# Patient Record
Sex: Male | Born: 1980
Health system: Southern US, Community
[De-identification: ages and names within clinical notes are randomized; demographics above are authoritative.]

## PROBLEM LIST (undated history)

## (undated) HISTORY — PX: OTHER SURGICAL HISTORY: SHX169

---

## 2016-04-15 ENCOUNTER — Encounter: Payer: Self-pay | Admitting: Internal Medicine

## 2016-04-15 ENCOUNTER — Ambulatory Visit (INDEPENDENT_AMBULATORY_CARE_PROVIDER_SITE_OTHER): Payer: Commercial Managed Care - PPO | Admitting: Internal Medicine

## 2016-04-15 VITALS — BP 118/79 | HR 82 | Resp 16 | Ht 69.0 in | Wt 167.5 lb

## 2016-04-15 DIAGNOSIS — J3089 Other allergic rhinitis: Secondary | ICD-10-CM | POA: Diagnosis not present

## 2016-04-15 DIAGNOSIS — F17200 Nicotine dependence, unspecified, uncomplicated: Secondary | ICD-10-CM | POA: Insufficient documentation

## 2016-04-15 DIAGNOSIS — Z23 Encounter for immunization: Secondary | ICD-10-CM | POA: Insufficient documentation

## 2016-04-15 MED ORDER — FLUTICASONE PROPIONATE 50 MCG/ACT NA SUSP: 2.0000 | Freq: Every day | NASAL | 6 refills | Status: DC

## 2016-04-15 NOTE — Patient Instructions (Signed)
Health Maintenance  Topic Date Due  . HIV Screening  08/27/1994  . INFLUENZA VACCINE  02/27/2017 (Originally 12/29/2015)  . TETANUS/TDAP  04/15/2026

## 2016-04-15 NOTE — Progress Notes (Signed)
Date:  04/15/2016   Name:  Darin RoysJames Arenas   DOB:  [birth year]   MRN:  [id number]   Chief Complaint: Establish Care (No PCP in Past ) New patient here to establish care.  He has no serious health concerns.  His wife and employers have been encouraging him to get a PCP.  Dental problem - he has on going dental issues and is gradually having all his teeth extracted.  Sinus congestion/red eyes - he has intermittent allergy sx but does not know of a trigger.  He has tried benadryl and sudafed but not allegra or zyrtec.  He denies vision change or sinus discharge currently.  Review of Systems  Constitutional: Negative for chills, fatigue and fever.  HENT: Positive for dental problem and sinus pressure. Negative for sore throat.   Eyes: Positive for redness and itching. Negative for pain, discharge and visual disturbance.  Respiratory: Negative for cough, chest tightness and shortness of breath.   Cardiovascular: Negative for chest pain, palpitations and leg swelling.  Gastrointestinal: Negative for abdominal pain.  Genitourinary: Negative for dysuria.  Musculoskeletal: Positive for arthralgias and gait problem.  Neurological: Negative for dizziness and headaches.  Psychiatric/Behavioral: Positive for sleep disturbance. Negative for dysphoric mood.    Patient Active Problem List   Diagnosis Date Noted  . Tobacco use disorder 04/15/2016    Prior to Admission medications   Medication Sig Start Date End Date Taking? Authorizing Provider  ibuprofen (ADVIL,MOTRIN) 200 MG tablet Take 200 mg by mouth every 6 (six) hours as needed.   Yes Historical Provider, MD    No Known Allergies  Past Surgical History:  Procedure Laterality Date  . none      Social History  Substance Use Topics  . Smoking status: Current Every Day Smoker    Packs/day: 1.00    Years: 5.00    Types: Cigarettes  . Smokeless tobacco: Never Used  . Alcohol use Yes     Comment: occasional      Medication list has  been reviewed and updated.   Physical Exam  Constitutional: He is oriented to person, place, and time. He appears well-developed. No distress.  HENT:  Head: Normocephalic and atraumatic.  Eyes: EOM are normal. Pupils are equal, round, and reactive to light. Right eye exhibits chemosis. Left eye exhibits chemosis.  Neck: Normal range of motion. Neck supple. No thyromegaly present.  Cardiovascular: Normal rate, regular rhythm and normal heart sounds.   Pulmonary/Chest: Effort normal and breath sounds normal. No respiratory distress. He has no wheezes.  Abdominal: Soft. Normal appearance and bowel sounds are normal. He exhibits no distension. There is no tenderness. There is no rebound.  Musculoskeletal: He exhibits no edema or tenderness.  Neurological: He is alert and oriented to person, place, and time.  Skin: Skin is warm and dry. No rash noted.  Psychiatric: He has a normal mood and affect. His speech is normal and behavior is normal. Thought content normal.  Nursing note and vitals reviewed.   BP 118/79   Pulse 82   Resp 16   Ht 5\' 9"  (1.753 m)   Wt 167 lb 8 oz (76 kg)   SpO2 98%   BMI 24.74 kg/m   Assessment and Plan: 1. Environmental and seasonal allergies With sinus congestion and red eyes - fluticasone (FLONASE) 50 MCG/ACT nasal spray; Place 2 sprays into both nostrils daily.  Dispense: 16 g; Refill: 6  2. Tobacco use disorder Discussed options   Bari EdwardLaura Berglund, MD [facility]  04/15/2016

## 2016-06-02 ENCOUNTER — Ambulatory Visit: Payer: Commercial Managed Care - PPO | Admitting: Internal Medicine

## 2017-03-23 ENCOUNTER — Ambulatory Visit: Payer: Commercial Managed Care - PPO | Admitting: Internal Medicine

## 2017-03-24 ENCOUNTER — Ambulatory Visit (INDEPENDENT_AMBULATORY_CARE_PROVIDER_SITE_OTHER): Payer: Commercial Managed Care - PPO | Admitting: Internal Medicine

## 2017-03-24 ENCOUNTER — Encounter: Payer: Self-pay | Admitting: Internal Medicine

## 2017-03-24 VITALS — BP 116/78 | HR 86 | Ht 69.0 in | Wt 163.2 lb

## 2017-03-24 DIAGNOSIS — Z23 Encounter for immunization: Secondary | ICD-10-CM

## 2017-03-24 DIAGNOSIS — K645 Perianal venous thrombosis: Secondary | ICD-10-CM | POA: Insufficient documentation

## 2017-03-24 DIAGNOSIS — Z872 Personal history of diseases of the skin and subcutaneous tissue: Secondary | ICD-10-CM

## 2017-03-24 MED ORDER — HYDROCORTISONE 2.5 % RE CREA: 1.0000 "application " | TOPICAL_CREAM | Freq: Two times a day (BID) | RECTAL | 0 refills | Status: DC

## 2017-03-24 NOTE — Progress Notes (Signed)
Date:  03/24/2017   Name:  Darin Morales   DOB:  1980/12/18   MRN:  [id number]   Chief Complaint: Hemorrhoids (Had this problem last week - used otc meds. much better than was before. Still having a little discomfort. No blood. Also, having some bumps popping up around rectum. he "takes care of them" and then they go away. ) and Immunizations (Flu Vaccine)  HPI  Hemorrhoids -patient reports having episodes of what he suspected were hemorrhoids in years past.  Never diagnosed or treated by physician.  Most recently last week had hemorrhoids that were much larger and more tender than previously.  No bleeding.  No preceding bowel problems such as diarrhea or constipation.  He is an over-the-counter hemorrhoid cream and he feels much better.  Recurrent skin abscesses -he reports what sounds like recurrent small abscesses around the buttock region.  These have never required draining.  They have been small but have left some scarring.  Currently no active lesions are present.   Review of Systems  Constitutional: Negative for chills, fatigue and fever.  Respiratory: Negative for chest tightness and shortness of breath.   Cardiovascular: Negative for chest pain.  Gastrointestinal: Negative for abdominal pain, blood in stool, constipation and diarrhea.  Skin: Negative for color change, rash and wound.  Psychiatric/Behavioral: Negative for sleep disturbance.    Patient Active Problem List   Diagnosis Date Noted  . Tobacco use disorder 04/15/2016  . Need for diphtheria-tetanus-pertussis (Tdap) vaccine 04/15/2016    Prior to Admission medications   Medication Sig Start Date End Date Taking? Authorizing Provider  fluticasone (FLONASE) 50 MCG/ACT nasal spray Place 2 sprays into both nostrils daily. 04/15/16  Yes Reubin MilanBerglund, Hadia Minier H, MD  ibuprofen (ADVIL,MOTRIN) 200 MG tablet Take 200 mg by mouth every 6 (six) hours as needed.   Yes [provider]    No Known Allergies  Past Surgical  History:  Procedure Laterality Date  . none      Social History  Substance Use Topics  . Smoking status: Current Every Day Smoker    Packs/day: 1.00    Years: 5.00    Types: Cigarettes  . Smokeless tobacco: Never Used  . Alcohol use Yes     Comment: occasional      Medication list has been reviewed and updated.  PHQ 2/9 Scores 03/24/2017  PHQ - 2 Score 0    Physical Exam  Constitutional: He is oriented to person, place, and time. He appears well-developed. No distress.  HENT:  Head: Normocephalic and atraumatic.  Pulmonary/Chest: Effort normal. No respiratory distress.  Genitourinary: Rectal exam shows external hemorrhoid (two thrombosed hemorroids at 6 and 9 oclcol).  Musculoskeletal: Normal range of motion.  Neurological: He is alert and oriented to person, place, and time.  Skin: Skin is warm and dry. No rash noted.  Several small scars on right buttock and coccyx region c/w previous healed lesions  Psychiatric: He has a normal mood and affect. His behavior is normal. Thought content normal.  Nursing note and vitals reviewed.   BP 116/78   Pulse 86   Ht 5\' 9"  (1.753 m)   Wt 163 lb 3.2 oz (74 kg)   SpO2 100%   BMI 24.10 kg/m   Assessment and Plan: 1. External thrombosed hemorrhoids Pt is reassured Return if sx worsen - hydrocortisone (ANUSOL-HC) 2.5 % rectal cream; Place 1 application rectally 2 (two) times daily.  Dispense: 30 g; Refill: 0  2. Need for influenza vaccination -  Flu Vaccine QUAD 36+ mos IM   Meds ordered this encounter  Medications  . hydrocortisone (ANUSOL-HC) 2.5 % rectal cream    Sig: Place 1 application rectally 2 (two) times daily.    Dispense:  30 g    Refill:  0    Partially dictated using Animal nutritionist. Any errors are unintentional.  Bari Edward, MD [facility]  03/24/2017

## 2017-06-23 ENCOUNTER — Encounter: Payer: Self-pay | Admitting: Internal Medicine

## 2017-06-23 ENCOUNTER — Ambulatory Visit: Payer: Commercial Managed Care - PPO | Admitting: Internal Medicine

## 2017-06-23 VITALS — BP 112/68 | HR 94 | Ht 69.0 in | Wt 164.0 lb

## 2017-06-23 DIAGNOSIS — J40 Bronchitis, not specified as acute or chronic: Secondary | ICD-10-CM

## 2017-06-23 DIAGNOSIS — G4726 Circadian rhythm sleep disorder, shift work type: Secondary | ICD-10-CM

## 2017-06-23 MED ORDER — LEVOFLOXACIN 500 MG PO TABS: 500.0000 mg | ORAL_TABLET | Freq: Every day | ORAL | 0 refills | Status: DC

## 2017-06-23 MED ORDER — ZOLPIDEM TARTRATE ER 12.5 MG PO TBCR: 12.5000 mg | EXTENDED_RELEASE_TABLET | Freq: Every evening | ORAL | 0 refills | Status: DC | PRN

## 2017-06-23 NOTE — Patient Instructions (Signed)
Delsym cough over the counter

## 2017-06-23 NOTE — Progress Notes (Signed)
Date:  06/23/2017   Name:  Darin Morales   DOB:  November 25, 1980   MRN:  [id number]   Chief Complaint: Cough (Started Sunday- no production. Dry cough. Bend down to tie shoes- headache, and heaviness in head and eyes. Fever- chills.  ) and Insomnia (Only sleeps maybe 2 hours in a day. Very tired. Works 3rd shift. Now out of the blue cannot get any rest. Has done this for 40 years. )  Cough  This is a new problem. The current episode started in the past 7 days. The problem has been unchanged. The cough is non-productive. Associated symptoms include a fever and headaches. Pertinent negatives include no chest pain, chills, sore throat, shortness of breath or wheezing. There is no history of environmental allergies.  Insomnia  Primary symptoms: sleep disturbance, difficulty falling asleep, frequent awakening.  The onset quality is undetermined. The problem occurs nightly. Exacerbated by: third shift work.      Review of Systems  Constitutional: Positive for fatigue and fever. Negative for chills.  HENT: Positive for congestion, sinus pressure and sinus pain. Negative for sore throat.   Respiratory: Positive for cough and chest tightness. Negative for shortness of breath and wheezing.   Cardiovascular: Negative for chest pain and palpitations.  Gastrointestinal: Negative for abdominal pain.  Musculoskeletal: Negative for arthralgias.  Skin: Negative for color change.  Allergic/Immunologic: Negative for environmental allergies.  Neurological: Positive for headaches. Negative for dizziness, tremors and light-headedness.  Hematological: Negative for adenopathy.  Psychiatric/Behavioral: Positive for sleep disturbance. Negative for dysphoric mood. The patient has insomnia.     Patient Active Problem List   Diagnosis Date Noted  . External thrombosed hemorrhoids 03/24/2017  . History of folliculitis 03/24/2017  . Tobacco use disorder 04/15/2016  . Need for diphtheria-tetanus-pertussis (Tdap)  vaccine 04/15/2016    Prior to Admission medications   Medication Sig Start Date End Date Taking? Authorizing Provider  fluticasone (FLONASE) 50 MCG/ACT nasal spray Place 2 sprays into both nostrils daily. 04/15/16  Yes Reubin Milan, MD  hydrocortisone (ANUSOL-HC) 2.5 % rectal cream Place 1 application rectally 2 (two) times daily. 03/24/17  Yes Reubin Milan, MD  ibuprofen (ADVIL,MOTRIN) 200 MG tablet Take 200 mg by mouth every 6 (six) hours as needed.   Yes [provider]    No Known Allergies  Past Surgical History:  Procedure Laterality Date  . none      Social History   Tobacco Use  . Smoking status: Current Every Day Smoker    Packs/day: 1.00    Years: 5.00    Pack years: 5.00    Types: Cigarettes  . Smokeless tobacco: Never Used  Substance Use Topics  . Alcohol use: Yes    Comment: occasional   . Drug use: No     Medication list has been reviewed and updated.  PHQ 2/9 Scores 03/24/2017  PHQ - 2 Score 0    Physical Exam  Constitutional: He is oriented to person, place, and time. He appears well-developed and well-nourished.  HENT:  Right Ear: External ear normal.  Left Ear: Tympanic membrane, external ear and ear canal normal. Tympanic membrane is not erythematous and not retracted.  Nose: Right sinus exhibits maxillary sinus tenderness and frontal sinus tenderness. Left sinus exhibits maxillary sinus tenderness and frontal sinus tenderness.  Mouth/Throat: Uvula is midline and mucous membranes are normal. No oral lesions. Posterior oropharyngeal erythema present. No oropharyngeal exudate.  Right ear occluded by cerumen  Cardiovascular: Normal rate, regular rhythm  and normal heart sounds.  Pulmonary/Chest: He has wheezes. He has no rales.  Lymphadenopathy:    He has no cervical adenopathy.  Neurological: He is alert and oriented to person, place, and time.    BP 112/68   Pulse 94   Ht 5\' 9"  (1.753 m)   Wt 164 lb (74.4 kg)   SpO2 99%    BMI 24.22 kg/m   Assessment and Plan: 1. Bronchitis Delsym otc cough syrup - levofloxacin (LEVAQUIN) 500 MG tablet; Take 1 tablet (500 mg total) by mouth daily.  Dispense: 7 tablet; Refill: 0  2. Shift work sleep disorder Begin ambien when antibiotics complete - zolpidem (AMBIEN CR) 12.5 MG CR tablet; Take 1 tablet (12.5 mg total) by mouth at bedtime as needed for sleep.  Dispense: 30 tablet; Refill: 0   Meds ordered this encounter  Medications  . levofloxacin (LEVAQUIN) 500 MG tablet    Sig: Take 1 tablet (500 mg total) by mouth daily.    Dispense:  7 tablet    Refill:  0  . zolpidem (AMBIEN CR) 12.5 MG CR tablet    Sig: Take 1 tablet (12.5 mg total) by mouth at bedtime as needed for sleep.    Dispense:  30 tablet    Refill:  0    Partially dictated using Animal nutritionistDragon software. Any errors are unintentional.  Bari EdwardLaura Berglund, MD [facility]  06/23/2017

## 2017-10-10 ENCOUNTER — Encounter: Payer: Self-pay | Admitting: Internal Medicine

## 2017-10-10 ENCOUNTER — Ambulatory Visit (INDEPENDENT_AMBULATORY_CARE_PROVIDER_SITE_OTHER): Payer: Commercial Managed Care - PPO | Admitting: Internal Medicine

## 2017-10-10 ENCOUNTER — Ambulatory Visit
Admission: RE | Admit: 2017-10-10 | Discharge: 2017-10-10 | Disposition: A | Discharge status: Home / Self Care | Payer: Commercial Managed Care - PPO | Source: Ambulatory Visit | Attending: Internal Medicine | Admitting: Internal Medicine

## 2017-10-10 VITALS — BP 118/78 | HR 96 | Resp 16 | Ht 69.0 in | Wt 156.6 lb

## 2017-10-10 DIAGNOSIS — M25531 Pain in right wrist: Secondary | ICD-10-CM | POA: Diagnosis not present

## 2017-10-10 DIAGNOSIS — M79641 Pain in right hand: Secondary | ICD-10-CM | POA: Insufficient documentation

## 2017-10-10 NOTE — Progress Notes (Signed)
Date:  10/10/2017   Name:  Darin Morales   DOB:  [birth year]   MRN:  [id number]   Chief Complaint: Hand Pain Larey Seat Friday while grilling and hurt Right hand while catching fall in grass. Also had a cooking tool in hand and scratched his hand up when fell. Unable to lift or do anything with R hand at all without pain. Some swelling noticed in fingers )  Hand Injury   The incident occurred 3 to 5 days ago. The incident occurred at home. The injury mechanism was a fall (onto his clenched fist onto grass). The pain is present in the right hand. The quality of the pain is described as burning and shooting. The pain radiates to the right hand. The pain is moderate. Pertinent negatives include no chest pain, numbness or tingling. The symptoms are aggravated by movement. Treatments tried: salonpas. The treatment provided no relief.  He also sustained a small laceration on his index finger.  Tdap is up to date.   Review of Systems  Constitutional: Negative for chills and fatigue.  Respiratory: Negative for chest tightness and shortness of breath.   Cardiovascular: Negative for chest pain and palpitations.  Musculoskeletal: Positive for arthralgias (hand and wrist pain, swelling in fingers).  Neurological: Negative for tingling and numbness.    Patient Active Problem List   Diagnosis Date Noted  . External thrombosed hemorrhoids 03/24/2017  . History of folliculitis 03/24/2017  . Tobacco use disorder 04/15/2016  . Need for diphtheria-tetanus-pertussis (Tdap) vaccine 04/15/2016    Prior to Admission medications   Medication Sig Start Date End Date Taking? Authorizing Provider  zolpidem (AMBIEN CR) 12.5 MG CR tablet Take 1 tablet (12.5 mg total) by mouth at bedtime as needed for sleep. Patient not taking: Reported on 10/10/2017 06/23/17   Reubin Milan, MD    No Known Allergies  Past Surgical History:  Procedure Laterality Date  . none      Social History   Tobacco Use  . Smoking  status: Current Every Day Smoker    Packs/day: 0.50    Years: 5.00    Pack years: 2.50    Types: Cigarettes  . Smokeless tobacco: Never Used  Substance Use Topics  . Alcohol use: Yes    Comment: occasional   . Drug use: No     Medication list has been reviewed and updated.  PHQ 2/9 Scores 10/10/2017 03/24/2017  PHQ - 2 Score 0 0    Physical Exam  Constitutional: He is oriented to person, place, and time. He appears well-developed. No distress.  HENT:  Head: Normocephalic and atraumatic.  Pulmonary/Chest: Effort normal. No respiratory distress.  Musculoskeletal: Normal range of motion.       Right wrist: He exhibits tenderness and swelling. He exhibits no deformity.  Tender at base of 5th finger along lateral hand Mild swelling of fingers 2-5 No deformity noted Grip limited by wrist and lateral hand pain Pulses intact, sensation normal  Neurological: He is alert and oriented to person, place, and time.  Skin: Skin is warm and dry. No rash noted.  Psychiatric: He has a normal mood and affect. His behavior is normal. Thought content normal.  Nursing note and vitals reviewed.   BP 118/78   Pulse 96   Resp 16   Ht  (1.753 m)   Wt 156 lb 9.6 oz (71 kg)   SpO2 100%   BMI 23.13 kg/m   Assessment and Plan: 1. 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   Partially dictated using Animal nutritionist. Any errors are unintentional.  Bari Edward, MD [facility]  10/10/2017

## 2017-10-11 ENCOUNTER — Encounter: Payer: Self-pay | Admitting: Internal Medicine

## 2018-02-01 ENCOUNTER — Encounter: Payer: Self-pay | Admitting: Internal Medicine

## 2018-02-01 ENCOUNTER — Ambulatory Visit: Payer: Commercial Managed Care - PPO | Admitting: Internal Medicine

## 2018-02-01 VITALS — BP 96/58 | HR 83 | Temp 98.5°F | Ht 69.0 in | Wt 156.0 lb

## 2018-02-01 DIAGNOSIS — H6690 Otitis media, unspecified, unspecified ear: Secondary | ICD-10-CM

## 2018-02-01 MED ORDER — AMOXICILLIN-POT CLAVULANATE 875-125 MG PO TABS: 1.0000 | ORAL_TABLET | Freq: Two times a day (BID) | ORAL | 0 refills | Status: AC

## 2018-02-01 NOTE — Patient Instructions (Signed)
Tylenol 325 mg 2 of these, alternate with ibuprofen 800 mg - one or the other every 4-6 hours

## 2018-02-01 NOTE — Progress Notes (Signed)
Date:  02/01/2018   Name:  Darin Morales   DOB:  08/20/1980   MRN:  [id number]   Chief Complaint: Fever (Itchy throat. Only sleeping a few hours because he is hot and SOB. )  Sore Throat   This is a new problem. The current episode started in the past 7 days. The problem has been gradually worsening. Maximum temperature: chills and sweats but han not taken temperature. The pain is moderate. Associated symptoms include ear pain and headaches. Pertinent negatives include no coughing, swollen glands or trouble swallowing. He has tried NSAIDs for the symptoms. The treatment provided mild relief.      Review of Systems  Constitutional: Positive for chills, fatigue and fever.  HENT: Positive for ear pain and sore throat. Negative for rhinorrhea, sinus pressure, trouble swallowing and voice change.   Eyes: Negative for visual disturbance.  Respiratory: Negative for cough, chest tightness and wheezing.   Neurological: Positive for headaches. Negative for dizziness.    Patient Active Problem List   Diagnosis Date Noted  . External thrombosed hemorrhoids 03/24/2017  . History of folliculitis 03/24/2017  . Tobacco use disorder 04/15/2016  . Need for diphtheria-tetanus-pertussis (Tdap) vaccine 04/15/2016    No Known Allergies  Past Surgical History:  Procedure Laterality Date  . none      Social History   Tobacco Use  . Smoking status: Current Every Day Smoker    Packs/day: 0.50    Years: 5.00    Pack years: 2.50    Types: Cigarettes  . Smokeless tobacco: Never Used  Substance Use Topics  . Alcohol use: Yes    Comment: occasional   . Drug use: No     Medication list has been reviewed and updated.  Current Meds  Medication Sig  . zolpidem (AMBIEN CR) 12.5 MG CR tablet Take 1 tablet (12.5 mg total) by mouth at bedtime as needed for sleep.    PHQ 2/9 Scores 10/10/2017 03/24/2017  PHQ - 2 Score 0 0    Physical Exam  Constitutional: He is oriented to person, place, and  time. He appears well-developed. He has a sickly appearance. No distress.  HENT:  Head: Normocephalic and atraumatic.  Left Ear: Tympanic membrane is erythematous and retracted.  Nose: Right sinus exhibits no maxillary sinus tenderness. Left sinus exhibits no maxillary sinus tenderness.  Mouth/Throat: Posterior oropharyngeal erythema present. No oropharyngeal exudate or posterior oropharyngeal edema.  Right canal obscured by cerumen  Pulmonary/Chest: Effort normal. No respiratory distress.  Musculoskeletal: Normal range of motion.  Neurological: He is alert and oriented to person, place, and time.  Skin: Skin is warm and dry. No rash noted.  Psychiatric: He has a normal mood and affect. His behavior is normal. Thought content normal.  Nursing note and vitals reviewed.   BP (!) 96/58 (BP Location: Right Arm, Patient Position: Sitting, Cuff Size: Normal)   Pulse 83   Temp 98.5 F (36.9 C) (Oral)   Ht 5\' 9"  (1.753 m)   Wt 156 lb (70.8 kg)   SpO2 100%   BMI 23.04 kg/m   Assessment and Plan: 1. Acute otitis media, unspecified otitis media type Alternate tylenol with advil for pain,fever,chills - amoxicillin-clavulanate (AUGMENTIN) 875-125 MG tablet; Take 1 tablet by mouth 2 (two) times daily for 10 days.  Dispense: 20 tablet; Refill: 0   Meds ordered this encounter  Medications  . amoxicillin-clavulanate (AUGMENTIN) 875-125 MG tablet    Sig: Take 1 tablet by mouth 2 (two) times daily for  10 days.    Dispense:  20 tablet    Refill:  0    Partially dictated using Animal nutritionist. Any errors are unintentional.  Bari Edward, MD [facility]  02/01/2018   There are no diagnoses linked to this encounter.

## 2018-02-05 ENCOUNTER — Other Ambulatory Visit: Payer: Self-pay

## 2018-04-20 ENCOUNTER — Encounter: Payer: Self-pay | Admitting: Internal Medicine

## 2018-04-20 ENCOUNTER — Ambulatory Visit: Payer: Commercial Managed Care - PPO | Admitting: Internal Medicine

## 2018-04-20 VITALS — BP 112/68 | HR 89 | Temp 98.1°F | Ht 69.0 in | Wt 168.0 lb

## 2018-04-20 DIAGNOSIS — J0100 Acute maxillary sinusitis, unspecified: Secondary | ICD-10-CM

## 2018-04-20 MED ORDER — AMOXICILLIN-POT CLAVULANATE 875-125 MG PO TABS: 1.0000 | ORAL_TABLET | Freq: Two times a day (BID) | ORAL | 0 refills | Status: AC

## 2018-04-20 NOTE — Progress Notes (Signed)
Date:  04/20/2018   Name:  Darin Morales   DOB:  22-Sep-1980   MRN:  [id number]   Chief Complaint: Fatigue (Weakness, fatigue, and sleeping too much X 1 week ago. Body aches with ribs. )  URI   This is a new problem. The current episode started in the past 7 days. Associated symptoms include coughing, sinus pain and a sore throat. Pertinent negatives include no chest pain, diarrhea, ear pain, headaches, nausea, rash or wheezing. He has tried NSAIDs for the symptoms. The treatment provided mild relief.      Review of Systems  Constitutional: Positive for fatigue and fever. Negative for chills.  HENT: Positive for sinus pressure, sinus pain and sore throat. Negative for ear pain, trouble swallowing and voice change.   Respiratory: Positive for cough. Negative for chest tightness, shortness of breath and wheezing.   Cardiovascular: Negative for chest pain, palpitations and leg swelling.  Gastrointestinal: Negative for constipation, diarrhea and nausea.  Skin: Negative for color change and rash.  Neurological: Positive for light-headedness. Negative for dizziness and headaches.  Hematological: Negative for adenopathy.    Patient Active Problem List   Diagnosis Date Noted  . External thrombosed hemorrhoids 03/24/2017  . History of folliculitis 03/24/2017  . Tobacco use disorder 04/15/2016  . Need for diphtheria-tetanus-pertussis (Tdap) vaccine 04/15/2016    No Known Allergies  Past Surgical History:  Procedure Laterality Date  . none      Social History   Tobacco Use  . Smoking status: Current Every Day Smoker    Packs/day: 0.50    Years: 5.00    Pack years: 2.50    Types: Cigarettes  . Smokeless tobacco: Never Used  Substance Use Topics  . Alcohol use: Yes    Comment: occasional   . Drug use: No     Medication list has been reviewed and updated.  No outpatient medications have been marked as taking for the 04/20/18 encounter (Office Visit) with Reubin Milan,  MD.    [facility] 2/9 Scores 10/10/2017 03/24/2017  PHQ - 2 Score 0 0    Physical Exam  Constitutional: He is oriented to person, place, and time. He appears well-developed and well-nourished.  HENT:  Left Ear: External ear and ear canal normal. Tympanic membrane is not erythematous and not retracted.  Nose: Right sinus exhibits maxillary sinus tenderness. Right sinus exhibits no frontal sinus tenderness. Left sinus exhibits maxillary sinus tenderness. Left sinus exhibits no frontal sinus tenderness.  Mouth/Throat: Uvula is midline and mucous membranes are normal. No oral lesions. Posterior oropharyngeal erythema present. No oropharyngeal exudate.  Right ear canal obscured completed by cerumen  Neck: Normal range of motion. Neck supple.  Cardiovascular: Normal rate, regular rhythm and normal heart sounds.  Pulmonary/Chest: Breath sounds normal. He has no wheezes. He has no rales.  Lymphadenopathy:    He has no cervical adenopathy.  Neurological: He is alert and oriented to person, place, and time.    BP 112/68 (BP Location: Right Arm, Patient Position: Sitting, Cuff Size: Normal)   Pulse 89   Temp 98.1 F (36.7 C) (Oral)   Ht 5\' 9"  (1.753 m)   Wt 168 lb (76.2 kg)   SpO2 100%   BMI 24.81 kg/m   Assessment and Plan: 1. Acute non-recurrent maxillary sinusitis Continue Advil as needed Increase fluids, rest - amoxicillin-clavulanate (AUGMENTIN) 875-125 MG tablet; Take 1 tablet by mouth 2 (two) times daily for 10 days.  Dispense: 20 tablet; Refill: 0   Partially  dictated using Animal nutritionistDragon software. Any errors are unintentional.  Bari EdwardLaura Unita Detamore, MD [facility]  04/20/2018

## 2018-04-23 ENCOUNTER — Encounter: Payer: Self-pay | Admitting: Internal Medicine

## 2018-06-12 ENCOUNTER — Ambulatory Visit: Payer: Commercial Managed Care - PPO | Admitting: Internal Medicine

## 2018-06-12 ENCOUNTER — Encounter: Payer: Self-pay | Admitting: Internal Medicine

## 2018-06-12 VITALS — BP 118/70 | HR 106 | Ht 69.0 in | Wt 159.6 lb

## 2018-06-12 DIAGNOSIS — M778 Other enthesopathies, not elsewhere classified: Secondary | ICD-10-CM | POA: Diagnosis not present

## 2018-06-12 MED ORDER — MELOXICAM 15 MG PO TABS: 15.0000 mg | ORAL_TABLET | Freq: Every day | ORAL | 0 refills | Status: DC

## 2018-06-12 NOTE — Patient Instructions (Signed)
Tennis elbow strap - use while working.

## 2018-06-12 NOTE — Progress Notes (Signed)
Date:  06/12/2018   Name:  Darin Morales   DOB:  06-18-1980   MRN:  [id number]   Chief Complaint: Arm Pain (R) arm pain. Pain radiates from elbow down in to hand. Started X 2 weeks. Unsure of injured. If arm is straightened or if picking anything up it gets worse. )  Arm Pain   There was no injury mechanism. The pain is present in the right forearm and right elbow. The quality of the pain is described as aching. The pain does not radiate. The pain is mild. The pain has been fluctuating since the incident. Pertinent negatives include no chest pain, muscle weakness, numbness or tingling. The symptoms are aggravated by movement and lifting. He has tried acetaminophen for the symptoms. The treatment provided mild relief.    Review of Systems  Constitutional: Negative for chills and fatigue.  Respiratory: Negative for chest tightness and shortness of breath.   Cardiovascular: Negative for chest pain.  Musculoskeletal: Positive for arthralgias. Negative for myalgias.  Neurological: Negative for tingling, tremors, weakness and numbness.    Patient Active Problem List   Diagnosis Date Noted  . External thrombosed hemorrhoids 03/24/2017  . History of folliculitis 03/24/2017  . Tobacco use disorder 04/15/2016  . Need for diphtheria-tetanus-pertussis (Tdap) vaccine 04/15/2016    No Known Allergies  Past Surgical History:  Procedure Laterality Date  . none      Social History   Tobacco Use  . Smoking status: Current Every Day Smoker    Packs/day: 0.50    Years: 5.00    Pack years: 2.50    Types: Cigarettes  . Smokeless tobacco: Never Used  Substance Use Topics  . Alcohol use: Yes    Comment: occasional   . Drug use: No     Medication list has been reviewed and updated.  No outpatient medications have been marked as taking for the 06/12/18 encounter (Office Visit) with Reubin Milan, MD.    [facility] 2/9 Scores 06/12/2018 10/10/2017 03/24/2017  PHQ - 2 Score 0 0 0     Physical Exam Vitals signs and nursing note reviewed.  Constitutional:      General: He is not in acute distress.    Appearance: He is well-developed.  HENT:     Head: Normocephalic and atraumatic.  Pulmonary:     Effort: Pulmonary effort is normal. No respiratory distress.  Musculoskeletal: Normal range of motion.     Right elbow: Tenderness found.  Skin:    General: Skin is warm and dry.     Findings: No rash.  Neurological:     Mental Status: He is alert and oriented to person, place, and time.     Motor: Motor function is intact.     Coordination: Coordination is intact.  Psychiatric:        Behavior: Behavior normal.        Thought Content: Thought content normal.     BP 118/70 (BP Location: Right Arm, Patient Position: Sitting, Cuff Size: Normal)   Pulse (!) 106   Ht 5\' 9"  (1.753 m)   Wt 159 lb 9.6 oz (72.4 kg)   SpO2 99%   BMI 23.57 kg/m   Assessment and Plan: 1. Tendonitis of elbow, right Wear tennis elbow strap while working Take mobic daily - call if no improvement in 2-3 weeks - meloxicam (MOBIC) 15 MG tablet; Take 1 tablet (15 mg total) by mouth daily.  Dispense: 30 tablet; Refill: 0   Partially dictated using Dragon  software. Any errors are unintentional.  Bari Edward, MD [facility]  06/12/2018

## 2018-07-06 ENCOUNTER — Encounter: Payer: Self-pay | Admitting: Internal Medicine

## 2018-07-06 ENCOUNTER — Other Ambulatory Visit: Payer: Self-pay

## 2018-07-06 ENCOUNTER — Ambulatory Visit: Payer: Commercial Managed Care - PPO | Admitting: Internal Medicine

## 2018-07-06 VITALS — BP 104/70 | HR 78 | Ht 69.0 in | Wt 157.0 lb

## 2018-07-06 DIAGNOSIS — M778 Other enthesopathies, not elsewhere classified: Secondary | ICD-10-CM | POA: Diagnosis not present

## 2018-07-06 MED ORDER — PREDNISONE 10 MG PO TABS: 10.0000 mg | ORAL_TABLET | ORAL | 0 refills | Status: AC

## 2018-07-06 NOTE — Progress Notes (Signed)
Date:  07/06/2018   Name:  Darin Morales   DOB:  July 15, 1980   MRN:  [id number]   Chief Complaint: Tendonitis (Right Elbow. Follow up. Still no better. Taking Meloxicam everyday as prescribed. )  Elbow pain -  Seen a month ago with suspected tendonitis and treated with mobic and elbow strap.  He thinks the strap helped some but he still has persistent discomfort.  His job is very physical so he has no chance to rest.  He denies numbness, weakness, tingling in fingers, swelling, warmth or redness.  Review of Systems  Constitutional: Negative for chills, fatigue and fever.  Respiratory: Negative for shortness of breath.   Cardiovascular: Negative for chest pain and palpitations.  Musculoskeletal: Positive for arthralgias. Negative for joint swelling and myalgias.  Skin: Negative for color change.    Patient Active Problem List   Diagnosis Date Noted  . External thrombosed hemorrhoids 03/24/2017  . History of folliculitis 03/24/2017  . Tobacco use disorder 04/15/2016  . Need for diphtheria-tetanus-pertussis (Tdap) vaccine 04/15/2016    No Known Allergies  Past Surgical History:  Procedure Laterality Date  . none      Social History   Tobacco Use  . Smoking status: Current Every Day Smoker    Packs/day: 0.50    Years: 5.00    Pack years: 2.50    Types: Cigarettes  . Smokeless tobacco: Never Used  Substance Use Topics  . Alcohol use: Yes    Comment: occasional   . Drug use: No     Medication list has been reviewed and updated.  Current Meds  Medication Sig  . meloxicam (MOBIC) 15 MG tablet Take 1 tablet (15 mg total) by mouth daily.  Marland Kitchen zolpidem (AMBIEN CR) 12.5 MG CR tablet Take 1 tablet (12.5 mg total) by mouth at bedtime as needed for sleep.    PHQ 2/9 Scores 06/12/2018 10/10/2017 03/24/2017  PHQ - 2 Score 0 0 0    Physical Exam Vitals signs and nursing note reviewed.  Constitutional:      General: He is not in acute distress.    Appearance: He is  well-developed.  HENT:     Head: Normocephalic and atraumatic.  Neck:     Musculoskeletal: Normal range of motion and neck supple.  Cardiovascular:     Pulses: Normal pulses.          Radial pulses are 2+ on the right side and 2+ on the left side.     Heart sounds: Normal heart sounds.  Pulmonary:     Effort: Pulmonary effort is normal. No respiratory distress.  Musculoskeletal: Normal range of motion.     Right shoulder: He exhibits tenderness and bony tenderness. He exhibits normal range of motion, no swelling, no effusion and no deformity.     Right elbow: Tenderness found.  Skin:    General: Skin is warm and dry.     Findings: No rash.  Neurological:     Mental Status: He is alert and oriented to person, place, and time.     Sensory: Sensation is intact.     Motor: Motor function is intact.     Coordination: Coordination is intact.     Deep Tendon Reflexes: Reflexes are normal and symmetric.     Comments: Tinel's and phalen's negative  Psychiatric:        Behavior: Behavior normal.        Thought Content: Thought content normal.     BP 104/70  Pulse 78   Ht 5\' 9"  (1.753 m)   Wt 157 lb (71.2 kg)   SpO2 98%   BMI 23.18 kg/m   Assessment and Plan: 1. Tendonitis of elbow, right Continue elbow strap and add prednisone taper Stop mobic Call for ortho referral if no improvement Ice or heat after work for 15 minutes - predniSONE (DELTASONE) 10 MG tablet; Take 1 tablet (10 mg total) by mouth as directed for 6 days. Take 6,5,4,3,2,1 then stop  Dispense: 21 tablet; Refill: 0   Partially dictated using Animal nutritionistDragon software. Any errors are unintentional.  Bari EdwardLaura Nakema Fake, MD [facility]  07/06/2018

## 2018-07-06 NOTE — Patient Instructions (Addendum)
Hold Meloxicam while taking prednisone taper.  Try ice or heat after work for 10-15 minutes

## 2018-07-12 ENCOUNTER — Telehealth: Payer: Self-pay

## 2018-07-12 NOTE — Telephone Encounter (Signed)
I can refer him to Ortho.  If he is recovered by the appointment, he can cancel, otherwise see what they recommend.

## 2018-07-12 NOTE — Telephone Encounter (Signed)
   Patient called saying he finished prednisone and was told to call and let us know how he was doing.   He said his arm is better but still tight trying to extend arm.    Completed prednisone.   Please Advise.

## 2018-07-13 NOTE — Telephone Encounter (Signed)
Called patient and informed on VM. Awaiting call back.

## 2018-07-16 NOTE — Telephone Encounter (Signed)
Patient did not call back.   

## 2018-07-17 ENCOUNTER — Other Ambulatory Visit: Payer: Self-pay

## 2018-07-17 DIAGNOSIS — M778 Other enthesopathies, not elsewhere classified: Secondary | ICD-10-CM

## 2018-07-17 NOTE — Progress Notes (Signed)
Spoke with patient and his elbow is no better. Still the same. Wants referral to Orthopedics. Sent in referral. Patient agreed to go to [facility].

## 2018-08-13 ENCOUNTER — Encounter: Payer: Self-pay | Admitting: Internal Medicine

## 2018-08-13 ENCOUNTER — Ambulatory Visit: Payer: Commercial Managed Care - PPO | Admitting: Internal Medicine

## 2018-08-13 ENCOUNTER — Other Ambulatory Visit: Payer: Self-pay

## 2018-08-13 VITALS — BP 105/78 | HR 89 | Temp 98.3°F | Resp 16 | Ht 69.0 in | Wt 160.4 lb

## 2018-08-13 DIAGNOSIS — J0100 Acute maxillary sinusitis, unspecified: Secondary | ICD-10-CM | POA: Diagnosis not present

## 2018-08-13 DIAGNOSIS — H6121 Impacted cerumen, right ear: Secondary | ICD-10-CM | POA: Diagnosis not present

## 2018-08-13 MED ORDER — AZITHROMYCIN 250 MG PO TABS: ORAL_TABLET | ORAL | 0 refills | Status: AC

## 2018-08-13 NOTE — Progress Notes (Signed)
Date:  08/13/2018   Name:  Darin Morales   DOB:  [birth year]   MRN:  [id number]   Chief Complaint: Sinus Problem (facial pressure and pain x 1 week also felt fluy sick last week )  Sinus Problem  This is a new problem. The current episode started in the past 7 days. The problem has been gradually worsening since onset. There has been no fever. The pain is mild. Associated symptoms include congestion, coughing and sinus pressure. Pertinent negatives include no chills, headaches or shortness of breath.  This was preceeded by a mild fever and flu like symptoms which have now resolved.   Review of Systems  Constitutional: Negative for chills, fatigue and fever.  HENT: Positive for congestion, hearing loss (on right) and sinus pressure. Negative for trouble swallowing.   Respiratory: Positive for cough. Negative for chest tightness, shortness of breath and wheezing.   Cardiovascular: Negative for chest pain and palpitations.  Neurological: Negative for dizziness and headaches.    Patient Active Problem List   Diagnosis Date Noted  . External thrombosed hemorrhoids 03/24/2017  . History of folliculitis 03/24/2017  . Tobacco use disorder 04/15/2016  . Need for diphtheria-tetanus-pertussis (Tdap) vaccine 04/15/2016    No Known Allergies  Past Surgical History:  Procedure Laterality Date  . none      Social History   Tobacco Use  . Smoking status: Current Every Day Smoker    Packs/day: 0.50    Years: 5.00    Pack years: 2.50    Types: Cigarettes    Start date: 08/13/1998  . Smokeless tobacco: Never Used  Substance Use Topics  . Alcohol use: Yes    Comment: occasional   . Drug use: No     Medication list has been reviewed and updated.  No outpatient medications have been marked as taking for the 08/13/18 encounter (Office Visit) with Reubin Milan, MD.    [facility] 2/9 Scores 06/12/2018 10/10/2017 03/24/2017  PHQ - 2 Score 0 0 0    Physical Exam Vitals signs and nursing  note reviewed.  Constitutional:      General: He is not in acute distress.    Appearance: He is well-developed.  HENT:     Head: Normocephalic and atraumatic.     Right Ear: There is impacted cerumen.     Left Ear: Tympanic membrane and ear canal normal.     Nose:     Right Sinus: Maxillary sinus tenderness present.     Left Sinus: Maxillary sinus tenderness present.     Mouth/Throat:     Pharynx: No posterior oropharyngeal erythema or uvula swelling.     Comments: Impacted cerumen in right ear removed with curette.  Minor bleeding of external canal occurred.  Pt tolerated procedure well.  After clearing, the TM was visualized and appeared normal.  Pt had subjective improvement in hearing. Cardiovascular:     Rate and Rhythm: Normal rate and regular rhythm.  Pulmonary:     Effort: Pulmonary effort is normal. No respiratory distress.     Breath sounds: Normal breath sounds. No wheezing.  Musculoskeletal: Normal range of motion.  Skin:    General: Skin is warm and dry.     Findings: No rash.  Neurological:     Mental Status: He is alert and oriented to person, place, and time.  Psychiatric:        Behavior: Behavior normal.        Thought Content: Thought content normal.  Wt Readings from Last 3 Encounters:  08/13/18 160 lb 6.4 oz (72.8 kg)  07/06/18 157 lb (71.2 kg)  06/12/18 159 lb 9.6 oz (72.4 kg)    BP 105/78   Pulse 89   Temp 98.3 F (36.8 C) (Oral)   Resp 16   Ht 5\' 9"  (1.753 m)   Wt 160 lb 6.4 oz (72.8 kg)   SpO2 100%   BMI 23.69 kg/m   Assessment and Plan: 1. Acute non-recurrent maxillary sinusitis Following a sinus infection - azithromycin (ZITHROMAX Z-PAK) 250 MG tablet; UAD  Dispense: 6 each; Refill: 0  2. Impacted cerumen of right ear Removed with curette Pt to use H2O2 drops for several days   Partially dictated using Animal nutritionist. Any errors are unintentional.  Bari Edward, MD [facility]   08/13/2018

## 2018-08-14 ENCOUNTER — Encounter: Payer: Self-pay | Admitting: Internal Medicine

## 2018-08-20 ENCOUNTER — Encounter: Payer: Self-pay | Admitting: Internal Medicine

## 2018-08-27 ENCOUNTER — Encounter: Payer: Self-pay | Admitting: Internal Medicine

## 2018-09-03 ENCOUNTER — Other Ambulatory Visit: Payer: Self-pay | Admitting: Internal Medicine

## 2018-09-03 ENCOUNTER — Encounter: Payer: Self-pay | Admitting: Internal Medicine

## 2018-09-03 ENCOUNTER — Telehealth: Payer: Self-pay

## 2018-09-03 DIAGNOSIS — J0100 Acute maxillary sinusitis, unspecified: Secondary | ICD-10-CM

## 2018-09-03 MED ORDER — CLINDAMYCIN HCL 300 MG PO CAPS: 300.0000 mg | ORAL_CAPSULE | Freq: Three times a day (TID) | ORAL | 0 refills | Status: AC

## 2018-09-03 NOTE — Telephone Encounter (Signed)
I prescribed Clindamycin which will cover both sinusitis and dental infections.

## 2018-09-03 NOTE — Telephone Encounter (Signed)
Patient called saying he has had low grade fever ( nothing over 100 ), facial tightness, and dry cough. Last fever was last night at 99.0. Wants to try a different antibiotic?  Pharmacy: [facility] in [place].  *Please Advise*

## 2018-09-03 NOTE — Telephone Encounter (Signed)
Patient informed. 

## 2018-09-10 ENCOUNTER — Encounter: Payer: Self-pay | Admitting: Internal Medicine

## 2018-09-10 ENCOUNTER — Other Ambulatory Visit: Payer: Self-pay

## 2018-09-30 ENCOUNTER — Encounter: Payer: Self-pay | Admitting: Internal Medicine

## 2018-12-25 ENCOUNTER — Encounter: Payer: Self-pay | Admitting: Internal Medicine

## 2019-04-03 IMAGING — CR DG WRIST COMPLETE 3+V*R*
4 series · 4 of 4 positions shown · non-contrast
Comparison: Right hand series today reported separately.

CLINICAL DATA: 37-year-old male status post fall on the closed fist
4 days ago with continued pain from the palm are aspect rete
radiating to the base of the 5th metatarsal and ulnar aspect of the
wrist.

EXAM:
RIGHT WRIST - COMPLETE 3+ VIEW

[wrist pa]
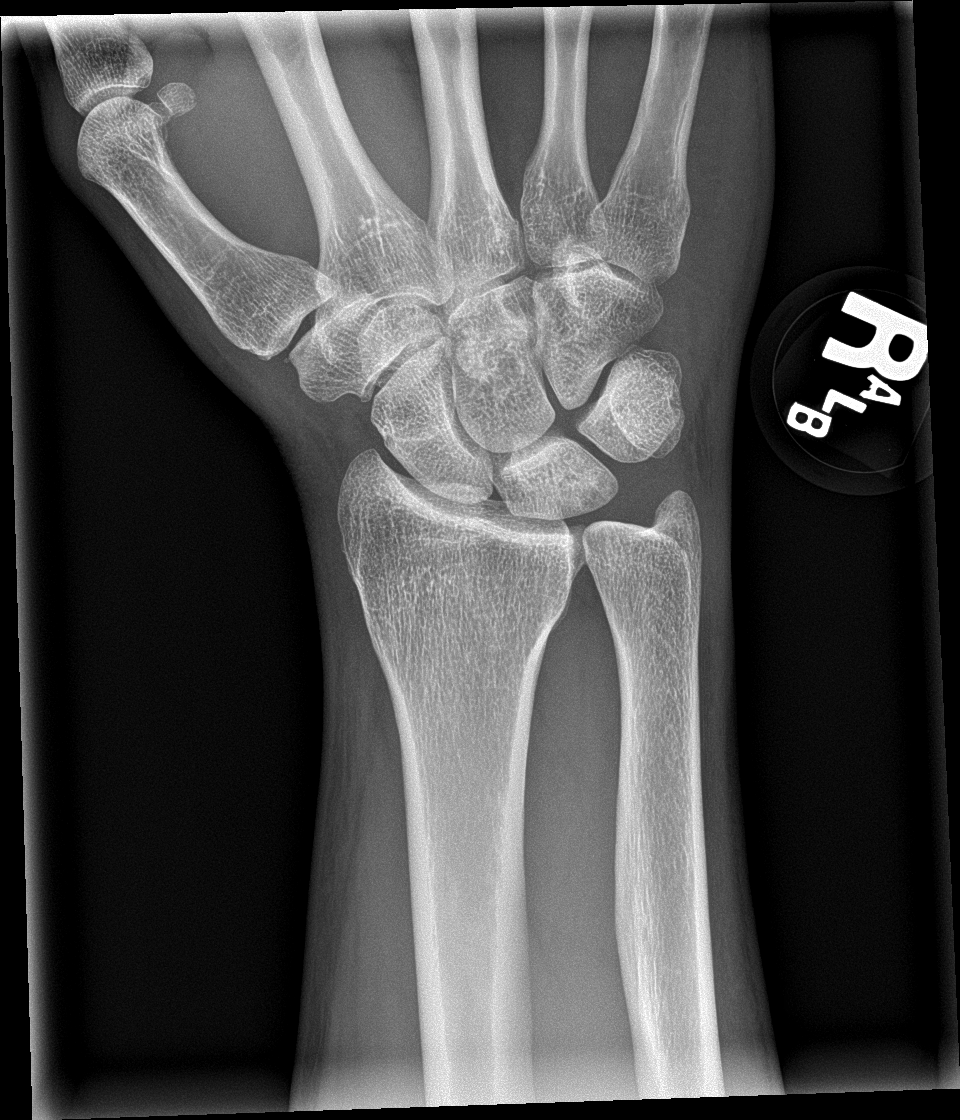

[wrist obl]
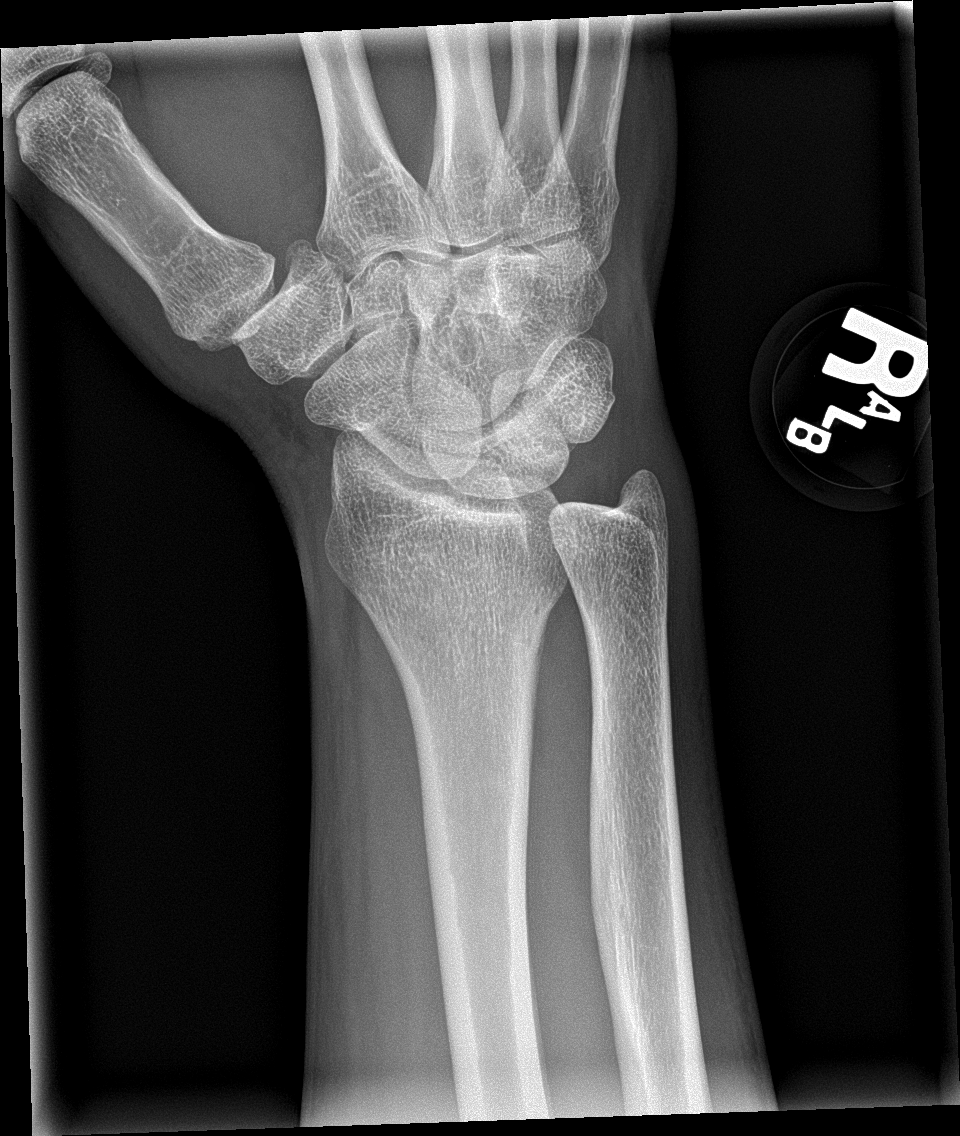

[wrist lat]
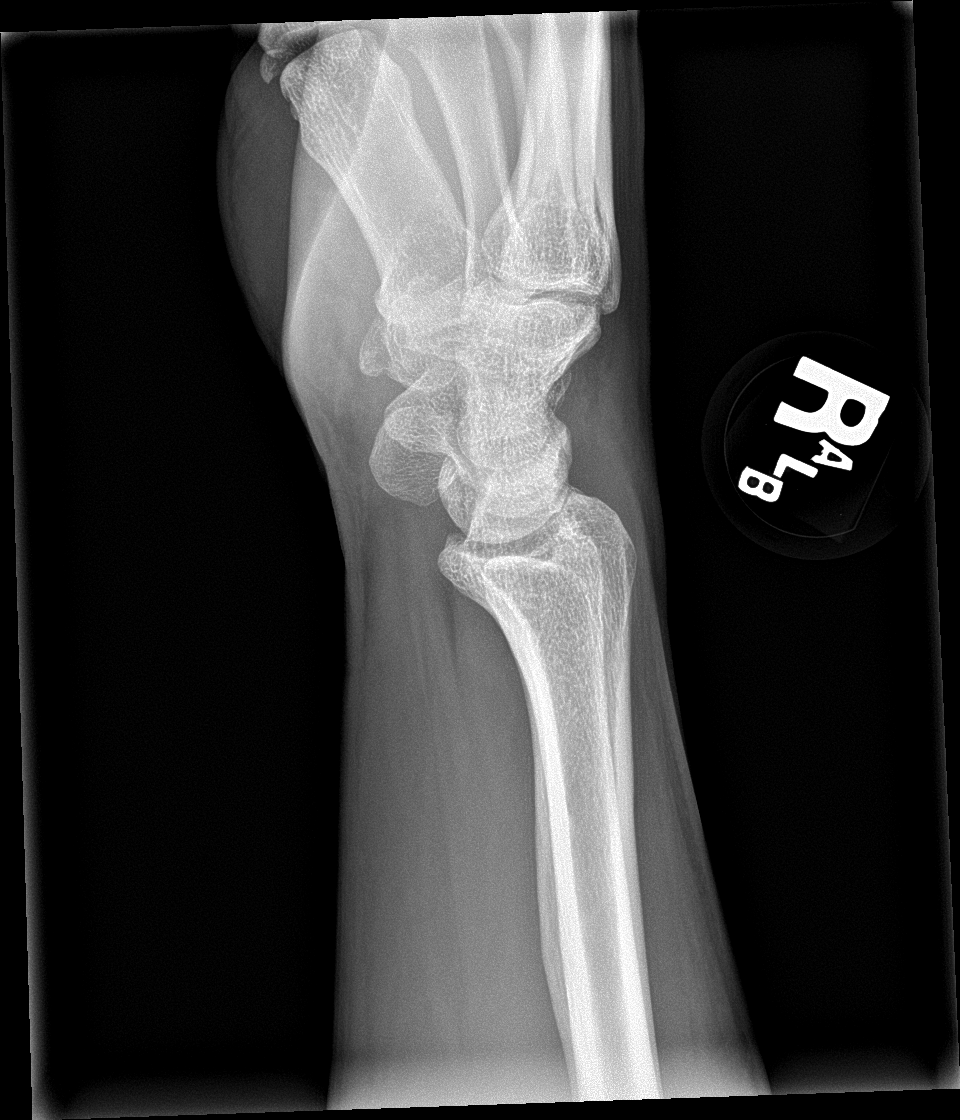

[wrist navicular]
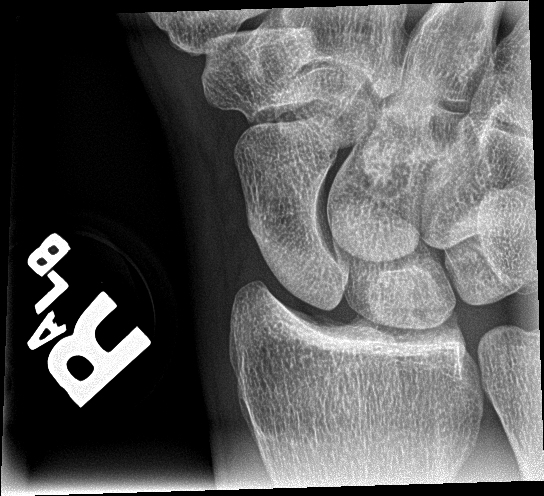

[4 of 4 positions shown; findings below may reference images not displayed]

FINDINGS: Bone mineralization is within normal limits. There is no evidence of
fracture or dislocation. There is no evidence of arthropathy or
other focal bone abnormality. Soft tissues are unremarkable.
IMPRESSION: Negative.

## 2019-04-19 ENCOUNTER — Telehealth: Payer: Self-pay

## 2019-04-19 NOTE — Telephone Encounter (Signed)
Patient called saying wife developed fever 2 days ago. Was tested for Covid at [facility] in [place] yesterday. Awaiting results. Asked for advice on where to be tested and when to be tested since he has no insurance at this time.  Gave pt [facility] info and times he can drive up and be tested. Told him to wait to develop symptoms but to quarantine until his wife's results are back just to be safe from spreading the virus to others due to possible exposure.  He verbalized understanding and will be tested at [facility] if sxs arise.

## 2019-12-27 ENCOUNTER — Ambulatory Visit: Payer: Self-pay | Admitting: Internal Medicine

## 2020-02-26 ENCOUNTER — Other Ambulatory Visit: Payer: Self-pay

## 2020-02-26 DIAGNOSIS — Z20822 Contact with and (suspected) exposure to covid-19: Secondary | ICD-10-CM

## 2020-02-27 ENCOUNTER — Encounter: Payer: Self-pay | Admitting: Internal Medicine

## 2020-02-27 LAB — SARS-COV-2, NAA 2 DAY TAT

## 2020-02-27 LAB — NOVEL CORONAVIRUS, NAA: SARS-CoV-2, NAA: NOT DETECTED

## 2020-05-13 ENCOUNTER — Encounter: Payer: Self-pay | Admitting: Internal Medicine

## 2020-05-13 DIAGNOSIS — Z20822 Contact with and (suspected) exposure to covid-19: Secondary | ICD-10-CM | POA: Diagnosis not present

## 2020-05-13 DIAGNOSIS — M791 Myalgia, unspecified site: Secondary | ICD-10-CM | POA: Diagnosis not present

## 2020-05-15 ENCOUNTER — Telehealth: Payer: Self-pay

## 2020-05-15 ENCOUNTER — Other Ambulatory Visit: Payer: Self-pay

## 2020-05-15 ENCOUNTER — Encounter: Payer: Self-pay | Admitting: Internal Medicine

## 2020-05-15 ENCOUNTER — Ambulatory Visit (INDEPENDENT_AMBULATORY_CARE_PROVIDER_SITE_OTHER): Payer: BC Managed Care – PPO | Admitting: Internal Medicine

## 2020-05-15 VITALS — Ht 69.0 in | Wt 160.0 lb

## 2020-05-15 DIAGNOSIS — F17200 Nicotine dependence, unspecified, uncomplicated: Secondary | ICD-10-CM

## 2020-05-15 DIAGNOSIS — K029 Dental caries, unspecified: Secondary | ICD-10-CM | POA: Diagnosis not present

## 2020-05-15 DIAGNOSIS — J0100 Acute maxillary sinusitis, unspecified: Secondary | ICD-10-CM | POA: Diagnosis not present

## 2020-05-15 MED ORDER — CLINDAMYCIN HCL 300 MG PO CAPS: 300.0000 mg | ORAL_CAPSULE | Freq: Three times a day (TID) | ORAL | 0 refills | Status: AC

## 2020-05-15 NOTE — Progress Notes (Signed)
Date:  05/15/2020   Name:  Darin Morales   DOB:  23-Sep-1980   MRN:  [id number]   I connected with this patient, Darin Morales, by telephone at the patient's home.  I verified that I am speaking with the correct person using two identifiers. This visit was conducted via telephone due to the Covid-19 outbreak from my office at [facility] in [place], [place].  Video connection could not be established. I discussed the limitations, risks, security and privacy concerns of performing an evaluation and management service by telephone. I also discussed with the patient that there may be a patient responsible charge related to this service. The patient expressed understanding and agreed to proceed.   Chief Complaint: Cough (Low grade fever, neg covid, neg flu test , both at [facility], alternating ibup. And tylenol /)  Cough This is a new problem. The current episode started in the past 7 days. The problem has been unchanged. The cough is non-productive. Associated symptoms include a fever (low grade), nasal congestion and shortness of breath. Pertinent negatives include no chest pain, chills, headaches or wheezing. He has tried OTC cough suppressant (tylenol and advil) for the symptoms.    No results found for: CREATININE, BUN, NA, K, CL, CO2 No results found for: CHOL, HDL, LDLCALC, LDLDIRECT, TRIG, CHOLHDL No results found for: TSH No results found for: HGBA1C No results found for: WBC, HGB, HCT, MCV, PLT No results found for: ALT, AST, GGT, ALKPHOS, BILITOT   Review of Systems  Constitutional: Positive for fever (low grade). Negative for chills.  HENT: Positive for dental problem.   Respiratory: Positive for cough and shortness of breath. Negative for wheezing.   Cardiovascular: Negative for chest pain and palpitations.  Neurological: Negative for dizziness and headaches.  Psychiatric/Behavioral: Negative for dysphoric mood and sleep disturbance. The patient is not  nervous/anxious.     Patient Active Problem List   Diagnosis Date Noted  . External thrombosed hemorrhoids 03/24/2017  . History of folliculitis 03/24/2017  . Tobacco use disorder 04/15/2016  . Need for diphtheria-tetanus-pertussis (Tdap) vaccine 04/15/2016    No Known Allergies  Past Surgical History:  Procedure Laterality Date  . none      Social History   Tobacco Use  . Smoking status: Current Every Day Smoker    Packs/day: 0.50    Years: 5.00    Pack years: 2.50    Types: Cigarettes    Start date: 08/13/1998  . Smokeless tobacco: Never Used  Substance Use Topics  . Alcohol use: Yes    Comment: occasional   . Drug use: No     Medication list has been reviewed and updated.  Current Meds  Medication Sig  . Acetaminophen (TYLENOL PO) Take by mouth as needed.  . IBUPROFEN PO Take by mouth as needed.    PHQ 2/9 Scores 05/15/2020 06/12/2018 10/10/2017 03/24/2017  PHQ - 2 Score 0 0 0 0  PHQ- 9 Score 2 - - -    GAD 7 : Generalized Anxiety Score 05/15/2020  Nervous, Anxious, on Edge 0  Control/stop worrying 0  Worry too much - different things 0  Trouble relaxing 0  Restless 0  Easily annoyed or irritable 0  Afraid - awful might happen 0  Total GAD 7 Score 0  Anxiety Difficulty Not difficult at all    BP Readings from Last 3 Encounters:  08/13/18 105/78  07/06/18 104/70  06/12/18 118/70    Physical Exam Pulmonary:  Comments: No cough or dyspnea noted during the call Neurological:     Mental Status: He is alert.  Psychiatric:        Mood and Affect: Mood normal.     Wt Readings from Last 3 Encounters:  05/15/20 160 lb (72.6 kg)  08/13/18 160 lb 6.4 oz (72.8 kg)  07/06/18 157 lb (71.2 kg)    Ht 5\' 9"  (1.753 m)   Wt 160 lb (72.6 kg)   BMI 23.63 kg/m   Assessment and Plan: 1. Acute non-recurrent maxillary sinusitis Continue tylenol or advil as needed He can return to work as long as his fever has resolved since flu and covid tests were  negative - clindamycin (CLEOCIN) 300 MG capsule; Take 1 capsule (300 mg total) by mouth 3 (three) times daily for 10 days.  Dispense: 30 capsule; Refill: 0  2. Dental caries Extensive dental disease is likely contributing to recurrent infections He plans to address this as soon as possible  3. Tobacco use disorder Also contributing to mild chronic cough  I spent 14 minutes on this encounter. Partially dictated using . Any errors are unintentional.  Animal nutritionist, MD [facility]  05/15/2020

## 2020-05-15 NOTE — Telephone Encounter (Signed)
This visit type is being conducted due to national recommendations for restrictions regarding the COVID- 19 Pandemic (e.g. social distancing) in effort to limit this patients exposure and mitigate transmission in our community. This visit type is felt to be most appropriate for this patient at this time.   I connected with the patient today and received telephone consent from the patient and patient understand this consent will be good for 1 year. 

## 2020-06-18 ENCOUNTER — Encounter: Payer: Self-pay | Admitting: Internal Medicine

## 2020-08-17 ENCOUNTER — Encounter: Payer: Self-pay | Admitting: Internal Medicine

## 2020-08-21 ENCOUNTER — Ambulatory Visit (INDEPENDENT_AMBULATORY_CARE_PROVIDER_SITE_OTHER): Payer: No Typology Code available for payment source | Admitting: Internal Medicine

## 2020-08-21 ENCOUNTER — Encounter: Payer: Self-pay | Admitting: Internal Medicine

## 2020-08-21 ENCOUNTER — Other Ambulatory Visit: Payer: Self-pay

## 2020-08-21 VITALS — BP 102/76 | HR 76 | Temp 98.3°F | Ht 69.0 in | Wt 170.0 lb

## 2020-08-21 DIAGNOSIS — Z1159 Encounter for screening for other viral diseases: Secondary | ICD-10-CM | POA: Diagnosis not present

## 2020-08-21 DIAGNOSIS — G4700 Insomnia, unspecified: Secondary | ICD-10-CM | POA: Insufficient documentation

## 2020-08-21 DIAGNOSIS — K219 Gastro-esophageal reflux disease without esophagitis: Secondary | ICD-10-CM | POA: Diagnosis not present

## 2020-08-21 DIAGNOSIS — Z0000 Encounter for general adult medical examination without abnormal findings: Secondary | ICD-10-CM

## 2020-08-21 DIAGNOSIS — F5101 Primary insomnia: Secondary | ICD-10-CM

## 2020-08-21 MED ORDER — OMEPRAZOLE 40 MG PO CPDR: 40.0000 mg | DELAYED_RELEASE_CAPSULE | Freq: Every day | ORAL | 1 refills | Status: DC

## 2020-08-21 NOTE — Progress Notes (Signed)
Date:  08/21/2020   Name:  Darin Morales   DOB:  January 25, 1981   MRN:  [id number]   Chief Complaint: Annual Exam  Darin Morales is a 40 y.o. male who presents today for his Complete Annual Exam. He feels well. He reports exercising active a twork. He reports he is sleeping fairly well.    Immunization History  Administered Date(s) Administered  . Influenza,inj,Quad PF,6+ Mos 03/24/2017  . Influenza-Unspecified 04/13/2018  . Tdap 04/15/2016    Gastroesophageal Reflux He complains of coughing (may be environmental), heartburn and water brash. He reports no abdominal pain, no chest pain, no choking or no wheezing. This is a new problem. The heartburn is located in the substernum. The heartburn wakes him from sleep. The heartburn limits his activity. The symptoms are aggravated by certain foods. Pertinent negatives include no fatigue.     Review of Systems  Constitutional: Negative for appetite change, chills, diaphoresis, fatigue and unexpected weight change.  HENT: Negative for hearing loss, tinnitus, trouble swallowing and voice change.   Eyes: Negative for visual disturbance.  Respiratory: Positive for cough (may be environmental). Negative for choking, shortness of breath and wheezing.   Cardiovascular: Negative for chest pain, palpitations and leg swelling.  Gastrointestinal: Positive for heartburn. Negative for abdominal pain, blood in stool, constipation and diarrhea.  Genitourinary: Negative for difficulty urinating, dysuria and frequency.  Musculoskeletal: Negative for arthralgias, back pain and myalgias.  Skin: Negative for color change and rash.  Neurological: Negative for dizziness, syncope and headaches.  Hematological: Negative for adenopathy.  Psychiatric/Behavioral: Negative for dysphoric mood and sleep disturbance.    Patient Active Problem List   Diagnosis Date Noted  . Insomnia disorder 08/21/2020  . Insomnia disorder 08/21/2020  . External thrombosed hemorrhoids  03/24/2017  . History of folliculitis 03/24/2017  . Tobacco use disorder 04/15/2016    No Known Allergies  Past Surgical History:  Procedure Laterality Date  . none      Social History   Tobacco Use  . Smoking status: Current Every Day Smoker    Packs/day: 0.50    Years: 5.00    Pack years: 2.50    Types: Cigarettes    Start date: 08/13/1998  . Smokeless tobacco: Never Used  Substance Use Topics  . Alcohol use: Yes    Comment: occasional   . Drug use: No     Medication list has been reviewed and updated.  Current Meds  Medication Sig  . Acetaminophen (TYLENOL PO) Take by mouth as needed.  . IBUPROFEN PO Take by mouth as needed.    PHQ 2/9 Scores 08/21/2020 05/15/2020 06/12/2018 10/10/2017  PHQ - 2 Score 0 0 0 0  PHQ- 9 Score 0 2 - -    GAD 7 : Generalized Anxiety Score 08/21/2020 05/15/2020  Nervous, Anxious, on Edge 0 0  Control/stop worrying 0 0  Worry too much - different things 0 0  Trouble relaxing 0 0  Restless 0 0  Easily annoyed or irritable 0 0  Afraid - awful might happen 0 0  Total GAD 7 Score 0 0  Anxiety Difficulty - Not difficult at all    BP Readings from Last 3 Encounters:  08/21/20 102/76  08/13/18 105/78  07/06/18 104/70    Physical Exam Vitals and nursing note reviewed.  Constitutional:      Appearance: Normal appearance. He is well-developed.  HENT:     Head: Normocephalic.     Right Ear: Tympanic membrane, ear canal and external  ear normal.     Left Ear: Tympanic membrane, ear canal and external ear normal.     Nose: Nose normal.  Eyes:     Conjunctiva/sclera: Conjunctivae normal.     Pupils: Pupils are equal, round, and reactive to light.  Neck:     Thyroid: No thyromegaly.     Vascular: No carotid bruit.  Cardiovascular:     Rate and Rhythm: Normal rate and regular rhythm.     Heart sounds: Normal heart sounds.  Pulmonary:     Effort: Pulmonary effort is normal.     Breath sounds: Normal breath sounds. No wheezing.   Chest:  Breasts:     Right: No mass.     Left: No mass.    Abdominal:     General: Bowel sounds are normal.     Palpations: Abdomen is soft.     Tenderness: There is no abdominal tenderness.  Musculoskeletal:        General: Normal range of motion.     Cervical back: Normal range of motion and neck supple.     Right lower leg: No edema.     Left lower leg: No edema.  Lymphadenopathy:     Cervical: No cervical adenopathy.  Skin:    General: Skin is warm and dry.     Capillary Refill: Capillary refill takes less than 2 seconds.  Neurological:     General: No focal deficit present.     Mental Status: He is alert and oriented to person, place, and time.     Deep Tendon Reflexes: Reflexes are normal and symmetric.  Psychiatric:        Attention and Perception: Attention normal.        Mood and Affect: Mood normal.        Thought Content: Thought content normal.     Wt Readings from Last 3 Encounters:  08/21/20 170 lb (77.1 kg)  05/15/20 160 lb (72.6 kg)  08/13/18 160 lb 6.4 oz (72.8 kg)    BP 102/76   Pulse 76   Temp 98.3 F (36.8 C) (Oral)   Ht 5\' 9"  (1.753 m)   Wt 170 lb (77.1 kg)   SpO2 98%   BMI 25.10 kg/m   Assessment and Plan: 1. Annual physical exam Normal exam Covid vaccine declined - CBC with Differential/Platelet - Comprehensive metabolic panel - Lipid panel  2. Need for hepatitis C screening test - Hepatitis C antibody  3. Primary insomnia Not currently an issue due to better job satisfaction and improved home situation  4. Gastroesophageal reflux disease, unspecified whether esophagitis present Begin PPI daily for 4-6 week then taper off  If sx recur, call for follow up - CBC with Differential/Platelet - H. pylori breath test - omeprazole (PRILOSEC) 40 MG capsule; Take 1 capsule (40 mg total) by mouth daily.  Dispense: 30 capsule; Refill: 1   Partially dictated using . Any errors are unintentional.  Animal nutritionist,  MD [facility]  08/21/2020

## 2020-08-21 NOTE — Patient Instructions (Signed)
Gastroesophageal Reflux Disease, Adult  Gastroesophageal reflux (GER) happens when acid from the stomach flows up into the tube that connects the mouth and the stomach (esophagus). Normally, food travels down the esophagus and stays in the stomach to be digested. With GER, food and stomach acid sometimes move back up into the esophagus. You may have a disease called gastroesophageal reflux disease (GERD) if the reflux:  Happens often.  Causes frequent or very bad symptoms.  Causes problems such as damage to the esophagus. When this happens, the esophagus becomes sore and swollen. Over time, GERD can make small holes (ulcers) in the lining of the esophagus. What are the causes? This condition is caused by a problem with the muscle between the esophagus and the stomach. When this muscle is weak or not normal, it does not close properly to keep food and acid from coming back up from the stomach. The muscle can be weak because of:  Tobacco use.  Pregnancy.  Having a certain type of hernia (hiatal hernia).  Alcohol use.  Certain foods and drinks, such as coffee, chocolate, onions, and peppermint. What increases the risk?  Being overweight.  Having a disease that affects your connective tissue.  Taking NSAIDs, such a ibuprofen. What are the signs or symptoms?  Heartburn.  Difficult or painful swallowing.  The feeling of having a lump in the throat.  A bitter taste in the mouth.  Bad breath.  Having a lot of saliva.  Having an upset or bloated stomach.  Burping.  Chest pain. Different conditions can cause chest pain. Make sure you see your doctor if you have chest pain.  Shortness of breath or wheezing.  A long-term cough or a cough at night.  Wearing away of the surface of teeth (tooth enamel).  Weight loss. How is this treated?  Making changes to your diet.  Taking medicine.  Having surgery. Treatment will depend on how bad your symptoms are. Follow these  instructions at home: Eating and drinking  Follow a diet as told by your doctor. You may need to avoid foods and drinks such as: ? Coffee and tea, with or without caffeine. ? Drinks that contain alcohol. ? Energy drinks and sports drinks. ? Bubbly (carbonated) drinks or sodas. ? Chocolate and cocoa. ? Peppermint and mint flavorings. ? Garlic and onions. ? Horseradish. ? Spicy and acidic foods. These include peppers, chili powder, curry powder, vinegar, hot sauces, and BBQ sauce. ? Citrus fruit juices and citrus fruits, such as oranges, lemons, and limes. ? Tomato-based foods. These include red sauce, chili, salsa, and pizza with red sauce. ? Fried and fatty foods. These include donuts, french fries, potato chips, and high-fat dressings. ? High-fat meats. These include hot dogs, rib eye steak, sausage, ham, and bacon. ? High-fat dairy items, such as whole milk, butter, and cream cheese.  Eat small meals often. Avoid eating large meals.  Avoid drinking large amounts of liquid with your meals.  Avoid eating meals during the 2-3 hours before bedtime.  Avoid lying down right after you eat.  Do not exercise right after you eat.   Lifestyle  Do not smoke or use any products that contain nicotine or tobacco. If you need help quitting, ask your doctor.  Try to lower your stress. If you need help doing this, ask your doctor.  If you are overweight, lose an amount of weight that is healthy for you. Ask your doctor about a safe weight loss goal.   General instructions    Pay attention to any changes in your symptoms.  Take over-the-counter and prescription medicines only as told by your doctor.  Do not take aspirin, ibuprofen, or other NSAIDs unless your doctor says it is okay.  Wear loose clothes. Do not wear anything tight around your waist.  Raise (elevate) the head of your bed about 6 inches (15 cm). You may need to use a wedge to do this.  Avoid bending over if this makes your  symptoms worse.  Keep all follow-up visits. Contact a doctor if:  You have new symptoms.  You lose weight and you do not know why.  You have trouble swallowing or it hurts to swallow.  You have wheezing or a cough that keeps happening.  You have a hoarse voice.  Your symptoms do not get better with treatment. Get help right away if:  You have sudden pain in your arms, neck, jaw, teeth, or back.  You suddenly feel sweaty, dizzy, or light-headed.  You have chest pain or shortness of breath.  You vomit and the vomit is green, yellow, or black, or it looks like blood or coffee grounds.  You faint.  Your poop (stool) is red, bloody, or black.  You cannot swallow, drink, or eat. These symptoms may represent a serious problem that is an emergency. Do not wait to see if the symptoms will go away. Get medical help right away. Call your local emergency services (911 in the [place] Do not drive yourself to the hospital. Summary  If a person has gastroesophageal reflux disease (GERD), food and stomach acid move back up into the esophagus and cause symptoms or problems such as damage to the esophagus.  Treatment will depend on how bad your symptoms are.  Follow a diet as told by your doctor.  Take all medicines only as told by your doctor. This information is not intended to replace advice given to you by your health care provider. Make sure you discuss any questions you have with your health care provider. Document Revised: 11/25/2019 Document Reviewed: 11/25/2019 Elsevier Patient Education  2021 [employer]  

## 2020-08-22 LAB — CBC WITH DIFFERENTIAL/PLATELET
Basophils Absolute: 0.1 10*3/uL (ref 0.0–0.2)
Basos: 1 %
EOS (ABSOLUTE): 0.2 10*3/uL (ref 0.0–0.4)
Eos: 2 %
Hematocrit: 42.1 % (ref 37.5–51.0)
Hemoglobin: 14.7 g/dL (ref 13.0–17.7)
Immature Grans (Abs): 0 10*3/uL (ref 0.0–0.1)
Immature Granulocytes: 0 %
Lymphocytes Absolute: 3.1 10*3/uL (ref 0.7–3.1)
Lymphs: 30 %
MCH: 31.1 pg (ref 26.6–33.0)
MCHC: 34.9 g/dL (ref 31.5–35.7)
MCV: 89 fL (ref 79–97)
Monocytes Absolute: 0.7 10*3/uL (ref 0.1–0.9)
Monocytes: 7 %
Neutrophils Absolute: 6.1 10*3/uL (ref 1.4–7.0)
Neutrophils: 60 %
Platelets: 201 10*3/uL (ref 150–450)
RBC: 4.73 x10E6/uL (ref 4.14–5.80)
RDW: 12.6 % (ref 11.6–15.4)
WBC: 10.3 10*3/uL (ref 3.4–10.8)

## 2020-08-22 LAB — COMPREHENSIVE METABOLIC PANEL
ALT: 22 IU/L (ref 0–44)
AST: 22 IU/L (ref 0–40)
Albumin/Globulin Ratio: 2.4 — ABNORMAL HIGH (ref 1.2–2.2)
Albumin: 4.4 g/dL (ref 4.0–5.0)
Alkaline Phosphatase: 69 IU/L (ref 44–121)
BUN/Creatinine Ratio: 7 — ABNORMAL LOW (ref 9–20)
BUN: 8 mg/dL (ref 6–24)
Bilirubin Total: 0.3 mg/dL (ref 0.0–1.2)
CO2: 21 mmol/L (ref 20–29)
Calcium: 9.2 mg/dL (ref 8.7–10.2)
Chloride: 102 mmol/L (ref 96–106)
Creatinine, Ser: 1.18 mg/dL (ref 0.76–1.27)
Globulin, Total: 1.8 g/dL (ref 1.5–4.5)
Glucose: 75 mg/dL (ref 65–99)
Potassium: 4 mmol/L (ref 3.5–5.2)
Sodium: 141 mmol/L (ref 134–144)
Total Protein: 6.2 g/dL (ref 6.0–8.5)
eGFR: 80 mL/min/{1.73_m2} (ref >59)

## 2020-08-22 LAB — HEPATITIS C ANTIBODY: Hep C Virus Ab: <0.1 s/co ratio (ref 0.0–0.9)

## 2020-08-22 LAB — H. PYLORI BREATH TEST: H pylori Breath Test: NEGATIVE

## 2020-08-22 LAB — LIPID PANEL
Chol/HDL Ratio: 4.2 ratio (ref 0.0–5.0)
Cholesterol, Total: 203 mg/dL — ABNORMAL HIGH (ref 100–199)
HDL: 48 mg/dL (ref >39)
LDL Chol Calc (NIH): 139 mg/dL — ABNORMAL HIGH (ref 0–99)
Triglycerides: 89 mg/dL (ref 0–149)
VLDL Cholesterol Cal: 16 mg/dL (ref 5–40)

## 2020-08-23 ENCOUNTER — Encounter: Payer: Self-pay | Admitting: Internal Medicine

## 2020-08-23 DIAGNOSIS — E785 Hyperlipidemia, unspecified: Secondary | ICD-10-CM | POA: Insufficient documentation

## 2020-09-03 ENCOUNTER — Encounter: Payer: Self-pay | Admitting: Internal Medicine

## 2020-09-04 ENCOUNTER — Other Ambulatory Visit: Payer: Self-pay

## 2020-09-04 DIAGNOSIS — K219 Gastro-esophageal reflux disease without esophagitis: Secondary | ICD-10-CM

## 2020-09-08 ENCOUNTER — Encounter: Payer: Self-pay | Admitting: *Deleted

## 2020-10-05 ENCOUNTER — Ambulatory Visit: Payer: Self-pay | Admitting: Gastroenterology

## 2020-10-05 ENCOUNTER — Other Ambulatory Visit: Payer: Self-pay

## 2020-10-05 NOTE — Progress Notes (Deleted)
Gastroenterology Consultation  Referring Provider:     Reubin Milan, MD Primary Care Physician:  Reubin Milan, MD Primary Gastroenterologist:  Dr. Servando Snare     Reason for Consultation:     GERD        HPI:   Darin Morales is a 40 y.o. y/o male referred for consultation & management of GERD by Dr. Judithann Graves, Nyoka Cowden, MD.  This patient was sent to me by his primary care provider because of symptoms of heartburn.  The patient at that time had reported that he was having a cough that he thought may be due to environmental causes.  The patient was also reporting some heartburn with a sour taste in his mouth/waterbrash.  At the time he saw his primary care provider he denied any choking wheezing abdominal pain or chest pain and had reported that the heartburn was a new issue for him.  He also reported that he was having the heartburn located in his substernal area and had reported that the symptoms were aggravated by certain foods and would wake him up in the middle the night.   No past medical history on file.  Past Surgical History:  Procedure Laterality Date  . none      Prior to Admission medications   Medication Sig Start Date End Date Taking? Authorizing Provider  Acetaminophen (TYLENOL PO) Take by mouth as needed.    [provider]  IBUPROFEN PO Take by mouth as needed.    [provider]  omeprazole (PRILOSEC) 40 MG capsule Take 1 capsule (40 mg total) by mouth daily. 08/21/20   Reubin Milan, MD    Family History  Problem Relation Age of Onset  . Diabetes Mother      Social History   Tobacco Use  . Smoking status: Current Every Day Smoker    Packs/day: 0.50    Years: 6.00    Pack years: 3.00    Types: Cigarettes    Start date: 08/13/1998  . Smokeless tobacco: Never Used  Substance Use Topics  . Alcohol use: Yes    Comment: occasional   . Drug use: No    Allergies as of 10/05/2020  . (No Known Allergies)    Review of Systems:    All  systems reviewed and negative except where noted in HPI.   Physical Exam:  There were no vitals taken for this visit. No LMP for male patient. General:   Alert,  Well-developed, well-nourished, pleasant and cooperative in NAD Head:  Normocephalic and atraumatic. Eyes:  Sclera clear, no icterus.   Conjunctiva pink. Ears:  Normal auditory acuity. Neck:  Supple; no masses or thyromegaly. Lungs:  Respirations even and unlabored.  Clear throughout to auscultation.   No wheezes, crackles, or rhonchi. No acute distress. Heart:  Regular rate and rhythm; no murmurs, clicks, rubs, or gallops. Abdomen:  Normal bowel sounds.  No bruits.  Soft, non-tender and non-distended without masses, hepatosplenomegaly or hernias noted.  No guarding or rebound tenderness.  Negative Carnett sign.   Rectal:  Deferred.  Pulses:  Normal pulses noted. Extremities:  No clubbing or edema.  No cyanosis. Neurologic:  Alert and oriented x3;  grossly normal neurologically. Skin:  Intact without significant lesions or rashes.  No jaundice. Lymph Nodes:  No significant cervical adenopathy. Psych:  Alert and cooperative. Normal mood and affect.  Imaging Studies: No results found.  Assessment and Plan:   Darin Morales is a 40 y.o. y/o male ***  Lucilla Lame, MD. Marval Regal    Note: This dictation was prepared with Dragon dictation along with smaller phrase technology. Any transcriptional errors that result from this process are unintentional.

## 2020-10-18 ENCOUNTER — Other Ambulatory Visit: Payer: Self-pay | Admitting: Internal Medicine

## 2020-10-18 DIAGNOSIS — K219 Gastro-esophageal reflux disease without esophagitis: Secondary | ICD-10-CM

## 2021-08-25 ENCOUNTER — Encounter: Payer: No Typology Code available for payment source | Admitting: Internal Medicine

## 2023-12-07 ENCOUNTER — Ambulatory Visit: Admitting: Family Medicine
